# Patient Record
Sex: Male | Born: 1990 | Race: Black or African American | Marital: Single | Smoking: Current every day smoker
Health system: Southern US, Community
[De-identification: ages and names within clinical notes are randomized; demographics above are authoritative.]

## PROBLEM LIST (undated history)

## (undated) DIAGNOSIS — Z789 Other specified health status: Secondary | ICD-10-CM

## (undated) HISTORY — PX: TONSILLECTOMY: SUR1361

## (undated) HISTORY — PX: TYMPANOSTOMY TUBE PLACEMENT: SHX32

---

## 2014-08-15 ENCOUNTER — Encounter (HOSPITAL_COMMUNITY): Payer: Self-pay

## 2014-08-15 ENCOUNTER — Observation Stay (HOSPITAL_COMMUNITY)
Admission: EM | Admit: 2014-08-15 | Discharge: 2014-08-16 | Disposition: A | Discharge status: Home / Self Care | Payer: Self-pay | Attending: General Surgery | Admitting: General Surgery

## 2014-08-15 ENCOUNTER — Emergency Department (HOSPITAL_COMMUNITY): Payer: Self-pay

## 2014-08-15 ENCOUNTER — Encounter (HOSPITAL_COMMUNITY)
Admission: EM | Disposition: A | Discharge status: Home / Self Care | Payer: Self-pay | Source: Home / Self Care | Attending: Emergency Medicine

## 2014-08-15 ENCOUNTER — Emergency Department (HOSPITAL_COMMUNITY): Payer: Self-pay | Admitting: Certified Registered Nurse Anesthetist

## 2014-08-15 DIAGNOSIS — S66222A Laceration of extensor muscle, fascia and tendon of left thumb at wrist and hand level, initial encounter: Secondary | ICD-10-CM | POA: Insufficient documentation

## 2014-08-15 DIAGNOSIS — D62 Acute posthemorrhagic anemia: Secondary | ICD-10-CM | POA: Diagnosis present

## 2014-08-15 DIAGNOSIS — S41112A Laceration without foreign body of left upper arm, initial encounter: Secondary | ICD-10-CM | POA: Insufficient documentation

## 2014-08-15 DIAGNOSIS — S41111A Laceration without foreign body of right upper arm, initial encounter: Secondary | ICD-10-CM | POA: Insufficient documentation

## 2014-08-15 DIAGNOSIS — F17210 Nicotine dependence, cigarettes, uncomplicated: Secondary | ICD-10-CM | POA: Insufficient documentation

## 2014-08-15 DIAGNOSIS — S6422XA Injury of radial nerve at wrist and hand level of left arm, initial encounter: Secondary | ICD-10-CM | POA: Insufficient documentation

## 2014-08-15 DIAGNOSIS — S1191XA Laceration without foreign body of unspecified part of neck, initial encounter: Principal | ICD-10-CM | POA: Insufficient documentation

## 2014-08-15 DIAGNOSIS — S0181XA Laceration without foreign body of other part of head, initial encounter: Secondary | ICD-10-CM | POA: Insufficient documentation

## 2014-08-15 DIAGNOSIS — W260XXA Contact with knife, initial encounter: Secondary | ICD-10-CM | POA: Insufficient documentation

## 2014-08-15 DIAGNOSIS — T07XXXA Unspecified multiple injuries, initial encounter: Secondary | ICD-10-CM

## 2014-08-15 DIAGNOSIS — S51812A Laceration without foreign body of left forearm, initial encounter: Secondary | ICD-10-CM | POA: Insufficient documentation

## 2014-08-15 HISTORY — PX: LACERATION REPAIR: SHX5284

## 2014-08-15 HISTORY — PX: OTHER SURGICAL HISTORY: SHX169

## 2014-08-15 HISTORY — DX: Other specified health status: Z78.9

## 2014-08-15 HISTORY — PX: WOUND EXPLORATION: SHX6188

## 2014-08-15 LAB — PREPARE FRESH FROZEN PLASMA
Unit division: 0
Unit division: 0

## 2014-08-15 LAB — TYPE AND SCREEN
ABO/RH(D): AB POS
ANTIBODY SCREEN: NEGATIVE
UNIT DIVISION: 0
Unit division: 0

## 2014-08-15 LAB — CBC WITH DIFFERENTIAL/PLATELET
BASOS PCT: 0 % (ref 0–1)
Basophils Absolute: 0 10*3/uL (ref 0.0–0.1)
EOS ABS: 0 10*3/uL (ref 0.0–0.7)
EOS PCT: 0 % (ref 0–5)
HCT: 45.6 % (ref 39.0–52.0)
Hemoglobin: 15.7 g/dL (ref 13.0–17.0)
LYMPHS ABS: 1.1 10*3/uL (ref 0.7–4.0)
Lymphocytes Relative: 11 % — ABNORMAL LOW (ref 12–46)
MCH: 30.4 pg (ref 26.0–34.0)
MCHC: 34.4 g/dL (ref 30.0–36.0)
MCV: 88.2 fL (ref 78.0–100.0)
MONOS PCT: 10 % (ref 3–12)
Monocytes Absolute: 0.9 10*3/uL (ref 0.1–1.0)
Neutro Abs: 7.5 10*3/uL (ref 1.7–7.7)
Neutrophils Relative %: 79 % — ABNORMAL HIGH (ref 43–77)
Platelets: 187 10*3/uL (ref 150–400)
RBC: 5.17 MIL/uL (ref 4.22–5.81)
RDW: 12.5 % (ref 11.5–15.5)
WBC: 9.5 10*3/uL (ref 4.0–10.5)

## 2014-08-15 LAB — ABO/RH: ABO/RH(D): AB POS

## 2014-08-15 SURGERY — REPAIR, LACERATION, 2 OR MORE
Anesthesia: General | Site: Hand | Laterality: Right

## 2014-08-15 MED ORDER — TETANUS-DIPHTH-ACELL PERTUSSIS 5-2.5-18.5 LF-MCG/0.5 IM SUSP
INTRAMUSCULAR | Status: AC
  Filled 2014-08-15: qty 0.5

## 2014-08-15 MED ORDER — FENTANYL CITRATE (PF) 100 MCG/2ML IJ SOLN
INTRAMUSCULAR | Status: DC | PRN
  Administered 2014-08-15: 100 ug via INTRAVENOUS
  Administered 2014-08-15: 50 ug via INTRAVENOUS
  Administered 2014-08-15: 100 ug via INTRAVENOUS

## 2014-08-15 MED ORDER — ETOMIDATE 2 MG/ML IV SOLN
INTRAVENOUS | Status: DC | PRN
  Administered 2014-08-15: 16 mg via INTRAVENOUS

## 2014-08-15 MED ORDER — SUCCINYLCHOLINE CHLORIDE 20 MG/ML IJ SOLN
INTRAMUSCULAR | Status: DC | PRN
  Administered 2014-08-15: 100 mg via INTRAVENOUS

## 2014-08-15 MED ORDER — CEFAZOLIN SODIUM-DEXTROSE 2-3 GM-% IV SOLR
INTRAVENOUS | Status: AC
  Administered 2014-08-15: 2 g via INTRAVENOUS
  Filled 2014-08-15: qty 50

## 2014-08-15 MED ORDER — TETANUS-DIPHTH-ACELL PERTUSSIS 5-2.5-18.5 LF-MCG/0.5 IM SUSP
0.5000 mL | Freq: Once | INTRAMUSCULAR | Status: AC
  Administered 2014-08-15: 0.5 mL via INTRAMUSCULAR

## 2014-08-15 MED ORDER — CEFAZOLIN SODIUM-DEXTROSE 2-3 GM-% IV SOLR
2.0000 g | Freq: Three times a day (TID) | INTRAVENOUS | Status: AC
  Administered 2014-08-15 – 2014-08-16 (×2): 2 g via INTRAVENOUS
  Filled 2014-08-15 (×3): qty 50

## 2014-08-15 MED ORDER — PROMETHAZINE HCL 25 MG/ML IJ SOLN
INTRAMUSCULAR | Status: AC
  Filled 2014-08-15: qty 1

## 2014-08-15 MED ORDER — SODIUM CHLORIDE 0.9 % IV SOLN
INTRAVENOUS | Status: DC
  Administered 2014-08-15: 17:00:00 via INTRAVENOUS

## 2014-08-15 MED ORDER — 0.9 % SODIUM CHLORIDE (POUR BTL) OPTIME
TOPICAL | Status: DC | PRN
  Administered 2014-08-15: 2000 mL

## 2014-08-15 MED ORDER — ONDANSETRON HCL 4 MG/2ML IJ SOLN: 4.0000 mg | Freq: Four times a day (QID) | INTRAMUSCULAR | Status: DC | PRN

## 2014-08-15 MED ORDER — MIDAZOLAM HCL 5 MG/5ML IJ SOLN
INTRAMUSCULAR | Status: DC | PRN
  Administered 2014-08-15: 2 mg via INTRAVENOUS

## 2014-08-15 MED ORDER — GLYCOPYRROLATE 0.2 MG/ML IJ SOLN
INTRAMUSCULAR | Status: DC | PRN
  Administered 2014-08-15: 0.4 mg via INTRAVENOUS

## 2014-08-15 MED ORDER — OXYCODONE HCL 5 MG PO TABS
5.0000 mg | ORAL_TABLET | ORAL | Status: DC | PRN
  Administered 2014-08-16: 10 mg via ORAL
  Filled 2014-08-15: qty 1
  Filled 2014-08-15: qty 2

## 2014-08-15 MED ORDER — NEOSTIGMINE METHYLSULFATE 10 MG/10ML IV SOLN
INTRAVENOUS | Status: DC | PRN
  Administered 2014-08-15: 3 mg via INTRAVENOUS

## 2014-08-15 MED ORDER — ALBUMIN HUMAN 5 % IV SOLN
INTRAVENOUS | Status: DC | PRN
  Administered 2014-08-15: 13:00:00 via INTRAVENOUS

## 2014-08-15 MED ORDER — HYDROMORPHONE HCL 1 MG/ML IJ SOLN
1.0000 mg | INTRAMUSCULAR | Status: DC | PRN
  Administered 2014-08-15 (×2): 1 mg via INTRAVENOUS
  Filled 2014-08-15 (×2): qty 1

## 2014-08-15 MED ORDER — PROMETHAZINE HCL 25 MG/ML IJ SOLN
6.2500 mg | INTRAMUSCULAR | Status: DC | PRN
Start: 2014-08-15 — End: 2014-08-15
  Administered 2014-08-15: 8 mg via INTRAVENOUS

## 2014-08-15 MED ORDER — DEXTROSE 5 % IV SOLN
10.0000 mg | INTRAVENOUS | Status: DC | PRN
  Administered 2014-08-15: 10 ug/min via INTRAVENOUS

## 2014-08-15 MED ORDER — ROCURONIUM BROMIDE 100 MG/10ML IV SOLN
INTRAVENOUS | Status: DC | PRN
  Administered 2014-08-15: 50 mg via INTRAVENOUS
  Administered 2014-08-15: 10 mg via INTRAVENOUS

## 2014-08-15 MED ORDER — ENOXAPARIN SODIUM 40 MG/0.4ML ~~LOC~~ SOLN
40.0000 mg | SUBCUTANEOUS | Status: DC
  Administered 2014-08-16: 40 mg via SUBCUTANEOUS
  Filled 2014-08-15: qty 0.4

## 2014-08-15 MED ORDER — ONDANSETRON HCL 4 MG PO TABS: 4.0000 mg | ORAL_TABLET | Freq: Four times a day (QID) | ORAL | Status: DC | PRN

## 2014-08-15 MED ORDER — ACETAMINOPHEN 325 MG PO TABS: 650.0000 mg | ORAL_TABLET | ORAL | Status: DC | PRN

## 2014-08-15 MED ORDER — CEFAZOLIN SODIUM-DEXTROSE 2-3 GM-% IV SOLR
2.0000 g | Freq: Once | INTRAVENOUS | Status: AC
  Administered 2014-08-15: 2 g via INTRAVENOUS

## 2014-08-15 MED ORDER — SODIUM CHLORIDE 0.9 % IV SOLN
INTRAVENOUS | Status: DC | PRN
  Administered 2014-08-15 (×3): via INTRAVENOUS

## 2014-08-15 MED ORDER — HYDROMORPHONE HCL 1 MG/ML IJ SOLN
INTRAMUSCULAR | Status: AC
  Filled 2014-08-15: qty 1

## 2014-08-15 MED ORDER — HYDROMORPHONE HCL 1 MG/ML IJ SOLN
0.2500 mg | INTRAMUSCULAR | Status: DC | PRN
  Administered 2014-08-15 (×4): 0.5 mg via INTRAVENOUS

## 2014-08-15 SURGICAL SUPPLY — 117 items
BANDAGE ELASTIC 3 VELCRO ST LF (GAUZE/BANDAGES/DRESSINGS) ×6 IMPLANT
BANDAGE ELASTIC 4 VELCRO ST LF (GAUZE/BANDAGES/DRESSINGS) ×6 IMPLANT
BANDAGE ELASTIC 6 VELCRO ST LF (GAUZE/BANDAGES/DRESSINGS) IMPLANT
BANDAGE ESMARK 6X9 LF (GAUZE/BANDAGES/DRESSINGS) ×4 IMPLANT
BLADE SURG 15 STRL LF DISP TIS (BLADE) IMPLANT
BLADE SURG 15 STRL SS (BLADE)
BNDG ESMARK 4X9 LF (GAUZE/BANDAGES/DRESSINGS) ×6 IMPLANT
BNDG ESMARK 6X9 LF (GAUZE/BANDAGES/DRESSINGS) ×6
BNDG GAUZE ELAST 4 BULKY (GAUZE/BANDAGES/DRESSINGS) ×6 IMPLANT
CANISTER SUCTION 2500CC (MISCELLANEOUS) ×6 IMPLANT
CHLORAPREP W/TINT 26ML (MISCELLANEOUS) ×6 IMPLANT
CLIP TI MEDIUM 24 (CLIP) ×6 IMPLANT
CLIP TI WIDE RED SMALL 24 (CLIP) ×6 IMPLANT
CLOSURE WOUND 1/4X4 (GAUZE/BANDAGES/DRESSINGS) ×1
CONT SPEC 4OZ CLIKSEAL STRL BL (MISCELLANEOUS) IMPLANT
CORDS BIPOLAR (ELECTRODE) ×12 IMPLANT
COVER MAYO STAND STRL (DRAPES) ×18 IMPLANT
COVER SURGICAL LIGHT HANDLE (MISCELLANEOUS) ×12 IMPLANT
CRADLE DONUT ADULT HEAD (MISCELLANEOUS) ×6 IMPLANT
CUFF TOURNIQUET SINGLE 18IN (TOURNIQUET CUFF) IMPLANT
CUFF TOURNIQUET SINGLE 24IN (TOURNIQUET CUFF) ×6 IMPLANT
DERMABOND ADVANCED (GAUZE/BANDAGES/DRESSINGS) ×2
DERMABOND ADVANCED .7 DNX12 (GAUZE/BANDAGES/DRESSINGS) ×4 IMPLANT
DRAPE EXTREMITY T 121X128X90 (DRAPE) IMPLANT
DRAPE ORTHO SPLIT 77X108 STRL (DRAPES) ×4
DRAPE PED LAPAROTOMY (DRAPES) ×6 IMPLANT
DRAPE PROXIMA HALF (DRAPES) ×6 IMPLANT
DRAPE SURG 17X23 STRL (DRAPES) ×6 IMPLANT
DRAPE SURG ORHT 6 SPLT 77X108 (DRAPES) ×8 IMPLANT
DRAPE UTILITY XL STRL (DRAPES) ×12 IMPLANT
DRSG ADAPTIC 3X8 NADH LF (GAUZE/BANDAGES/DRESSINGS) ×6 IMPLANT
DRSG EMULSION OIL 3X3 NADH (GAUZE/BANDAGES/DRESSINGS) ×6 IMPLANT
DRSG PAD ABDOMINAL 8X10 ST (GAUZE/BANDAGES/DRESSINGS) IMPLANT
ELECT CAUTERY BLADE 6.4 (BLADE) ×6 IMPLANT
ELECT REM PT RETURN 9FT ADLT (ELECTROSURGICAL) ×6
ELECTRODE REM PT RTRN 9FT ADLT (ELECTROSURGICAL) ×4 IMPLANT
GAUZE SPONGE 4X4 12PLY STRL (GAUZE/BANDAGES/DRESSINGS) ×42 IMPLANT
GAUZE SPONGE 4X4 16PLY XRAY LF (GAUZE/BANDAGES/DRESSINGS) ×6 IMPLANT
GAUZE XEROFORM 5X9 LF (GAUZE/BANDAGES/DRESSINGS) ×6 IMPLANT
GLOVE BIO SURGEON STRL SZ 6.5 (GLOVE) ×5 IMPLANT
GLOVE BIO SURGEONS STRL SZ 6.5 (GLOVE) ×1
GLOVE BIOGEL PI IND STRL 7.0 (GLOVE) ×16 IMPLANT
GLOVE BIOGEL PI IND STRL 8 (GLOVE) ×4 IMPLANT
GLOVE BIOGEL PI IND STRL 8.5 (GLOVE) ×4 IMPLANT
GLOVE BIOGEL PI INDICATOR 7.0 (GLOVE) ×8
GLOVE BIOGEL PI INDICATOR 8 (GLOVE) ×2
GLOVE BIOGEL PI INDICATOR 8.5 (GLOVE) ×2
GLOVE ECLIPSE 7.0 STRL STRAW (GLOVE) ×6 IMPLANT
GLOVE ECLIPSE 7.5 STRL STRAW (GLOVE) ×12 IMPLANT
GLOVE SURG ORTHO 8.0 STRL STRW (GLOVE) ×12 IMPLANT
GOWN STRL REUS W/ TWL LRG LVL3 (GOWN DISPOSABLE) ×12 IMPLANT
GOWN STRL REUS W/ TWL XL LVL3 (GOWN DISPOSABLE) ×4 IMPLANT
GOWN STRL REUS W/TWL 2XL LVL3 (GOWN DISPOSABLE) ×6 IMPLANT
GOWN STRL REUS W/TWL LRG LVL3 (GOWN DISPOSABLE) ×6
GOWN STRL REUS W/TWL XL LVL3 (GOWN DISPOSABLE) ×2
HEMOSTAT SURGICEL 2X14 (HEMOSTASIS) ×6 IMPLANT
KIT BASIN OR (CUSTOM PROCEDURE TRAY) ×12 IMPLANT
KIT ROOM TURNOVER OR (KITS) ×6 IMPLANT
LOOP VESSEL MAXI BLUE (MISCELLANEOUS) IMPLANT
LOOP VESSEL MINI RED (MISCELLANEOUS) IMPLANT
MARKER SKIN DUAL TIP RULER LAB (MISCELLANEOUS) ×6 IMPLANT
NEEDLE HYPO 25X1 1.5 SAFETY (NEEDLE) IMPLANT
NS IRRIG 1000ML POUR BTL (IV SOLUTION) ×12 IMPLANT
PACK GENERAL/GYN (CUSTOM PROCEDURE TRAY) ×6 IMPLANT
PACK ORTHO EXTREMITY (CUSTOM PROCEDURE TRAY) ×12 IMPLANT
PACK SURGICAL SETUP 50X90 (CUSTOM PROCEDURE TRAY) ×6 IMPLANT
PAD ARMBOARD 7.5X6 YLW CONV (MISCELLANEOUS) ×12 IMPLANT
PAD CAST 3X4 CTTN HI CHSV (CAST SUPPLIES) ×4 IMPLANT
PAD CAST 4YDX4 CTTN HI CHSV (CAST SUPPLIES) ×4 IMPLANT
PADDING CAST ABS 4INX4YD NS (CAST SUPPLIES) ×2
PADDING CAST ABS COTTON 4X4 ST (CAST SUPPLIES) ×4 IMPLANT
PADDING CAST COTTON 3X4 STRL (CAST SUPPLIES) ×2
PADDING CAST COTTON 4X4 STRL (CAST SUPPLIES) ×2
PENCIL BUTTON HOLSTER BLD 10FT (ELECTRODE) ×6 IMPLANT
SPEAR EYE SURG WECK-CEL (MISCELLANEOUS) IMPLANT
SPECIMEN JAR MEDIUM (MISCELLANEOUS) IMPLANT
SPONGE GAUZE 4X4 12PLY STER LF (GAUZE/BANDAGES/DRESSINGS) ×6 IMPLANT
SPONGE INTESTINAL PEANUT (DISPOSABLE) ×6 IMPLANT
SPONGE LAP 18X18 X RAY DECT (DISPOSABLE) ×6 IMPLANT
STAPLER VISISTAT (STAPLE) ×6 IMPLANT
STAPLER VISISTAT 35W (STAPLE) ×12 IMPLANT
STOCKINETTE IMPERVIOUS 9X36 MD (GAUZE/BANDAGES/DRESSINGS) IMPLANT
STOCKINETTE IMPERVIOUS LG (DRAPES) IMPLANT
STRIP CLOSURE SKIN 1/4X4 (GAUZE/BANDAGES/DRESSINGS) ×5 IMPLANT
SUCTION FRAZIER TIP 10 FR DISP (SUCTIONS) IMPLANT
SUT CHROMIC 1MO 4 18 CR8 (SUTURE) ×6 IMPLANT
SUT ETHIBOND 3-0 V-5 (SUTURE) IMPLANT
SUT ETHILON 3 0 PS 1 (SUTURE) ×12 IMPLANT
SUT ETHILON 4 0 PS 2 18 (SUTURE) IMPLANT
SUT ETHILON 5 0 P 3 18 (SUTURE) ×2
SUT FIBERWIRE 4-0 18 TAPR NDL (SUTURE) ×6
SUT MERSILENE 4 0 P 3 (SUTURE) IMPLANT
SUT NYLON ETHILON 5-0 P-3 1X18 (SUTURE) ×4 IMPLANT
SUT PROLENE 4 0 PS 2 18 (SUTURE) IMPLANT
SUT SILK 0 FSL (SUTURE) ×6 IMPLANT
SUT SILK 2 0 SH (SUTURE) ×6 IMPLANT
SUT SILK 3 0 (SUTURE) ×2
SUT SILK 3-0 18XBRD TIE 12 (SUTURE) ×4 IMPLANT
SUT VIC AB 2-0 SH 27 (SUTURE)
SUT VIC AB 2-0 SH 27XBRD (SUTURE) IMPLANT
SUT VIC AB 3-0 SH 18 (SUTURE) ×6 IMPLANT
SUT VIC AB 4-0 SH 27 (SUTURE) ×2
SUT VIC AB 4-0 SH 27XBRD (SUTURE) ×4 IMPLANT
SUT VICRYL 4-0 PS2 18IN ABS (SUTURE) ×12 IMPLANT
SUT VICRYL AB 3 0 TIES (SUTURE) ×6 IMPLANT
SUT VICRYL AB 4 0 18 (SUTURE) IMPLANT
SUTURE FIBERWR 4-0 18 TAPR NDL (SUTURE) ×4 IMPLANT
SYR BULB 3OZ (MISCELLANEOUS) ×6 IMPLANT
SYR BULB IRRIGATION 50ML (SYRINGE) ×6 IMPLANT
SYR CONTROL 10ML LL (SYRINGE) IMPLANT
TOWEL OR 17X24 6PK STRL BLUE (TOWEL DISPOSABLE) ×18 IMPLANT
TOWEL OR 17X26 10 PK STRL BLUE (TOWEL DISPOSABLE) ×18 IMPLANT
TRAY FOLEY METER SIL LF 16FR (CATHETERS) ×6 IMPLANT
TUBE CONNECTING 12'X1/4 (SUCTIONS) ×1
TUBE CONNECTING 12X1/4 (SUCTIONS) ×5 IMPLANT
UNDERPAD 30X30 INCONTINENT (UNDERPADS AND DIAPERS) ×12 IMPLANT
WATER STERILE IRR 1000ML POUR (IV SOLUTION) ×6 IMPLANT

## 2014-08-15 NOTE — ED Notes (Signed)
Spoke to Dr. Melvyn Novas who gave verbal order to obtain consent for procedure.
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
## 2014-08-15 NOTE — Anesthesia Procedure Notes (Signed)
Procedure Name: Intubation Date/Time: 08/15/2014 12:10 PM Performed by: Coralee Rud Pre-anesthesia Checklist: Patient identified, Emergency Drugs available, Suction available and Patient being monitored Patient Re-evaluated:Patient Re-evaluated prior to inductionOxygen Delivery Method: Circle system utilized Preoxygenation: Pre-oxygenation with 100% oxygen Intubation Type: IV induction Ventilation: Mask ventilation without difficulty Laryngoscope Size: Glidescope Grade View: Grade I Tube type: Oral Tube size: 7.5 mm Number of attempts: 1 Placement Confirmation: ETT inserted through vocal cords under direct vision,  positive ETCO2 and breath sounds checked- equal and bilateral Secured at: 22 cm
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
## 2014-08-15 NOTE — ED Notes (Signed)
Detective Lamona Curl --[place] -- [phone].

## 2014-08-15 NOTE — Anesthesia Postprocedure Evaluation (Signed)
  Anesthesia Post-op Note  Patient: Seth Davila  Procedure(s) Performed: Procedure(s) with comments: REPAIR MULTIPLE LACERATIONS (Right) - right arm, face, and neck,  WOUND EXPLORATION, left hand (Left)  Patient Location: PACU  Anesthesia Type:General  Level of Consciousness: awake and alert   Airway and Oxygen Therapy: Patient Spontanous Breathing  Post-op Pain: mild  Post-op Assessment: Post-op Vital signs reviewed              Post-op Vital Signs: Reviewed  Last Vitals:  Filed Vitals:   08/15/14 1642  BP: 130/70  Pulse: 87  Temp: 36.9 C  Resp: 19    Complications: No apparent anesthesia complications
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
## 2014-08-15 NOTE — ED Notes (Addendum)
Ortho Surgery consulted.

## 2014-08-15 NOTE — ED Notes (Signed)
Pt transported to OR
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
## 2014-08-15 NOTE — ED Notes (Signed)
See Trauma Narrator

## 2014-08-15 NOTE — Anesthesia Preprocedure Evaluation (Signed)
Anesthesia Evaluation  Patient identified by MRN, date of birth, ID band Patient awake    Reviewed: Allergy & Precautions, NPO status , Patient's Chart, lab work & pertinent test resultsPreop documentation limited or incomplete due to emergent nature of procedure.  Airway Mallampati: IV  TM Distance: >3 FB Neck ROM: Full  Mouth opening: Limited Mouth Opening  Dental   Pulmonary  breath sounds clear to auscultation        Cardiovascular Rhythm:Regular Rate:Normal     Neuro/Psych    GI/Hepatic   Endo/Other    Renal/GU      Musculoskeletal   Abdominal   Peds  Hematology   Anesthesia Other Findings   Reproductive/Obstetrics                             Anesthesia Physical Anesthesia Plan  ASA: III and emergent  Anesthesia Plan: General   Post-op Pain Management:    Induction: Intravenous  Airway Management Planned: Oral ETT  Additional Equipment:   Intra-op Plan:   Post-operative Plan: Extubation in OR  Informed Consent: I have reviewed the patients History and Physical, chart, labs and discussed the procedure including the risks, benefits and alternatives for the proposed anesthesia with the patient or authorized representative who has indicated his/her understanding and acceptance.   Dental advisory given  Plan Discussed with: CRNA  Anesthesia Plan Comments:         Anesthesia Quick Evaluation

## 2014-08-15 NOTE — Transfer of Care (Signed)
Immediate Anesthesia Transfer of Care Note  Patient: Seth Davila  Procedure(s) Performed: Procedure(s) with comments: REPAIR MULTIPLE LACERATIONS (Right) - right arm, face, and neck,  WOUND EXPLORATION, left hand (Left)  Patient Location: PACU  Anesthesia Type:General  Level of Consciousness: awake, alert , oriented and patient cooperative  Airway & Oxygen Therapy: Patient Spontanous Breathing and Patient connected to nasal cannula oxygen  Post-op Assessment: Report given to RN, Post -op Vital signs reviewed and stable and Patient moving all extremities  Post vital signs: Reviewed and stable  Last Vitals:  Filed Vitals:   08/15/14 1149  BP: 175/104  Pulse: 90  Resp: 113    Complications: No apparent anesthesia complications
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
## 2014-08-15 NOTE — ED Notes (Signed)
Fluids running wide open to gravity.
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
## 2014-08-16 ENCOUNTER — Encounter (HOSPITAL_COMMUNITY): Payer: Self-pay | Admitting: General Practice

## 2014-08-16 DIAGNOSIS — D62 Acute posthemorrhagic anemia: Secondary | ICD-10-CM | POA: Diagnosis present

## 2014-08-16 LAB — CBC
HEMATOCRIT: 30.5 % — AB (ref 39.0–52.0)
HEMOGLOBIN: 10.3 g/dL — AB (ref 13.0–17.0)
MCH: 30 pg (ref 26.0–34.0)
MCHC: 33.8 g/dL (ref 30.0–36.0)
MCV: 88.9 fL (ref 78.0–100.0)
Platelets: 165 10*3/uL (ref 150–400)
RBC: 3.43 MIL/uL — AB (ref 4.22–5.81)
RDW: 12.6 % (ref 11.5–15.5)
WBC: 6.8 10*3/uL (ref 4.0–10.5)

## 2014-08-16 LAB — BASIC METABOLIC PANEL
Anion gap: 8 (ref 5–15)
BUN: <5 mg/dL — ABNORMAL LOW (ref 6–20)
CHLORIDE: 106 mmol/L (ref 101–111)
CO2: 25 mmol/L (ref 22–32)
CREATININE: 0.82 mg/dL (ref 0.61–1.24)
Calcium: 8.4 mg/dL — ABNORMAL LOW (ref 8.9–10.3)
GFR calc Af Amer: >60 mL/min (ref >60)
Glucose, Bld: 103 mg/dL — ABNORMAL HIGH (ref 65–99)
POTASSIUM: 3 mmol/L — AB (ref 3.5–5.1)
SODIUM: 139 mmol/L (ref 135–145)

## 2014-08-16 LAB — BLOOD PRODUCT ORDER (VERBAL) VERIFICATION

## 2014-08-16 MED ORDER — IBUPROFEN 200 MG PO TABS: 400.0000 mg | ORAL_TABLET | Freq: Four times a day (QID) | ORAL | Status: AC | PRN

## 2014-08-16 MED ORDER — OXYCODONE-ACETAMINOPHEN 5-325 MG PO TABS: 1.0000 | ORAL_TABLET | Freq: Four times a day (QID) | ORAL | Status: AC | PRN

## 2014-08-16 MED ORDER — CEPHALEXIN 250 MG PO CAPS: 250.0000 mg | ORAL_CAPSULE | Freq: Four times a day (QID) | ORAL | Status: AC
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
## 2014-08-16 NOTE — Progress Notes (Signed)
Reviewed AVS with patient and his father. Patient was given prescription for roxicet  To take to his pharmacy. Also reminded pt to pick up prescription for keflex at his pharmacy. Patient stated that he did not have any questions. Patient ambulated with his father to his transportation.
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
## 2014-08-17 ENCOUNTER — Encounter (HOSPITAL_COMMUNITY): Payer: Self-pay | Admitting: General Surgery
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
